# Patient Record
Sex: Male | Born: 2000 | Race: White | Hispanic: No | Marital: Married
Health system: Western US, Academic
[De-identification: ages and names within clinical notes are randomized; demographics above are authoritative.]

## PROBLEM LIST (undated history)

## (undated) DIAGNOSIS — F902 Attention-deficit hyperactivity disorder, combined type: Secondary | ICD-10-CM

## (undated) DIAGNOSIS — Z789 Other specified health status: Secondary | ICD-10-CM

## (undated) HISTORY — PX: ADENOIDECTOMY: SUR15

## (undated) HISTORY — PX: LYMPH NODE BIOPSY: SHX201

## (undated) HISTORY — PX: TONSILLECTOMY: SUR1361

---
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
## 2022-09-27 ENCOUNTER — Ambulatory Visit (HOSPITAL_BASED_OUTPATIENT_CLINIC_OR_DEPARTMENT_OTHER): Payer: TRICARE Prime—HMO

## 2022-09-27 ENCOUNTER — Inpatient Hospital Stay (HOSPITAL_BASED_OUTPATIENT_CLINIC_OR_DEPARTMENT_OTHER): Payer: TRICARE Prime—HMO

## 2022-09-27 ENCOUNTER — Inpatient Hospital Stay
Admission: AC | Admit: 2022-09-27 | Discharge: 2022-09-28 | DRG: 563 | Disposition: A | Discharge status: Home / Self Care | Payer: TRICARE Prime—HMO | Attending: Student in an Organized Health Care Education/Training Program | Admitting: Student in an Organized Health Care Education/Training Program

## 2022-09-27 DIAGNOSIS — T07XXXA Unspecified multiple injuries, initial encounter: Secondary | ICD-10-CM | POA: Diagnosis present

## 2022-09-27 DIAGNOSIS — S40211A Abrasion of right shoulder, initial encounter: Secondary | ICD-10-CM | POA: Diagnosis present

## 2022-09-27 DIAGNOSIS — S70312A Abrasion, left thigh, initial encounter: Secondary | ICD-10-CM | POA: Diagnosis present

## 2022-09-27 DIAGNOSIS — S42021A Displaced fracture of shaft of right clavicle, initial encounter for closed fracture: Principal | ICD-10-CM | POA: Diagnosis present

## 2022-09-27 DIAGNOSIS — Z7409 Other reduced mobility: Secondary | ICD-10-CM | POA: Insufficient documentation

## 2022-09-27 DIAGNOSIS — S80211A Abrasion, right knee, initial encounter: Secondary | ICD-10-CM | POA: Diagnosis present

## 2022-09-27 DIAGNOSIS — S71012A Laceration without foreign body, left hip, initial encounter: Secondary | ICD-10-CM | POA: Diagnosis present

## 2022-09-27 DIAGNOSIS — S50311A Abrasion of right elbow, initial encounter: Secondary | ICD-10-CM | POA: Diagnosis present

## 2022-09-27 DIAGNOSIS — T1490XA Injury, unspecified, initial encounter: Secondary | ICD-10-CM | POA: Diagnosis present

## 2022-09-27 DIAGNOSIS — S70311A Abrasion, right thigh, initial encounter: Secondary | ICD-10-CM | POA: Diagnosis present

## 2022-09-27 DIAGNOSIS — F17290 Nicotine dependence, other tobacco product, uncomplicated: Secondary | ICD-10-CM | POA: Diagnosis present

## 2022-09-27 DIAGNOSIS — S42001A Fracture of unspecified part of right clavicle, initial encounter for closed fracture: Secondary | ICD-10-CM | POA: Diagnosis present

## 2022-09-27 DIAGNOSIS — S70212A Abrasion, left hip, initial encounter: Secondary | ICD-10-CM | POA: Diagnosis present

## 2022-09-27 DIAGNOSIS — Y92488 Other paved roadways as the place of occurrence of the external cause: Secondary | ICD-10-CM

## 2022-09-27 DIAGNOSIS — S80811A Abrasion, right lower leg, initial encounter: Secondary | ICD-10-CM | POA: Diagnosis present

## 2022-09-27 DIAGNOSIS — Z041 Encounter for examination and observation following transport accident: Secondary | ICD-10-CM

## 2022-09-27 DIAGNOSIS — Z789 Other specified health status: Secondary | ICD-10-CM | POA: Insufficient documentation

## 2022-09-27 DIAGNOSIS — G8911 Acute pain due to trauma: Secondary | ICD-10-CM | POA: Insufficient documentation

## 2022-09-27 DIAGNOSIS — Z289 Immunization not carried out for unspecified reason: Secondary | ICD-10-CM

## 2022-09-27 DIAGNOSIS — S30810A Abrasion of lower back and pelvis, initial encounter: Secondary | ICD-10-CM | POA: Diagnosis present

## 2022-09-27 DIAGNOSIS — S71002A Unspecified open wound, left hip, initial encounter: Secondary | ICD-10-CM

## 2022-09-27 DIAGNOSIS — F909 Attention-deficit hyperactivity disorder, unspecified type: Secondary | ICD-10-CM | POA: Diagnosis present

## 2022-09-27 LAB — VBG + CHEM PANEL + H&H RC
Base Excess, Ven (iSTAT): 4 mmol/L (ref <=3)
Bicarbonate, Ven (iSTAT): 29.3 mmol/L (ref 20.0–29.0)
Calcium, Ionized Ven (iSTAT): 1.22 mmol/L (ref 1.13–1.32)
FiO2, Ven (iSTAT): 21
Glucose, Ven (iSTAT): 99 mg/dL (ref 70–105)
Hct, Ven (iSTAT): 43 % (ref 38–51)
Hgb, Ven (Est.) (iSTAT): 14.6 g/dL (ref 12–17)
O2 SAT, Ven (iSTAT): 43 % — ABNORMAL LOW (ref 95–98)
Potassium, Ven (iSTAT): 3.2 mmol/L — ABNORMAL LOW (ref 3.5–4.9)
Sodium, Ven (iSTAT): 139 mmol/L (ref 138–146)
Temp, Ven (iSTAT): 97.4
pCO2, Ven (T) (iSTAT): 45 mmHg (ref 40–52)
pCO2, Ven (Uncorr) (iSTAT): 46.3 mmHg (ref 40–52)
pH, Ven (T) (iSTAT): 7.42 (ref 7.33–7.40)
pH, Ven (Uncorr) (iSTAT): 7.41 (ref 7.33–7.40)
pO2, Ven (T) (iSTAT): 23 mmHg — ABNORMAL LOW (ref 25–44)
pO2, Ven (Uncorr) (iSTAT): 24 mmHg — ABNORMAL LOW (ref 25–44)

## 2022-09-27 LAB — BASIC METABOLIC PANEL, BLOOD
Anion Gap: 13 mmol/L (ref 7–15)
BUN: 12 mg/dL (ref 8–23)
Bicarbonate: 24 mmol/L (ref 22–29)
Calcium: 9.3 mg/dL (ref 8.2–9.6)
Chloride: 104 mmol/L (ref 98–107)
Creatinine: 1.01 mg/dL (ref 0.67–1.17)
Glucose: 100 mg/dL — ABNORMAL HIGH (ref 70–99)
Potassium: 3.4 mmol/L — ABNORMAL LOW (ref 3.5–5.1)
Sodium: 141 mmol/L (ref 136–145)
eGFR Based on CKD-EPI 2021 Equation: 58 mL/min/{1.73_m2}

## 2022-09-27 LAB — HEMOGRAM, BLOOD
Hct: 41.7 %
Hgb: 15 gm/dL
MCH: 32 pg (ref 26.0–32.0)
MCHC: 36 g/dL (ref 32.0–36.0)
MCV: 88.9 um3 (ref 79.0–95.0)
MPV: 10.9 fL (ref 9.4–12.4)
Plt Count: 304 10*3/uL (ref 140–370)
RBC: 4.69 10*6/uL
RDW: 13.4 % (ref 12.0–14.0)
WBC: 7.4 10*3/uL (ref 4.0–10.0)

## 2022-09-27 LAB — PROTHROMBIN TIME, BLOOD
INR: 1
PT,Patient: 11.2 s (ref 9.7–12.5)

## 2022-09-27 LAB — PHOSPHORUS, BLOOD: Phosphorous: 2.7 mg/dL (ref 2.7–4.5)

## 2022-09-27 LAB — ALCOHOL, BLOOD: Alcohol: <11 mg/dL

## 2022-09-27 LAB — MAGNESIUM, BLOOD: Magnesium: 1.8 mg/dL (ref 1.7–2.3)

## 2022-09-27 LAB — APTT, BLOOD: PTT: 21 s — ABNORMAL LOW (ref 27–36)

## 2022-09-27 MED ORDER — OXYCODONE HCL 5 MG OR TABS
5.0000 mg | ORAL_TABLET | ORAL | Status: DC | PRN
Start: 2022-09-27 — End: 2022-09-28
  Administered 2022-09-27: 5 mg via ORAL
  Filled 2022-09-27: qty 1

## 2022-09-27 MED ORDER — SODIUM CHLORIDE 0.9 % IV SOLN
INTRAVENOUS | Status: DC
Start: 2022-09-27 — End: 2022-09-27

## 2022-09-27 MED ORDER — BISACODYL 10 MG RE SUPP
10.0000 mg | Freq: Every day | RECTAL | Status: DC | PRN
Start: 2022-09-27 — End: 2022-09-28

## 2022-09-27 MED ORDER — NALOXONE HCL 0.4 MG/ML IJ SOLN
0.1000 mg | INTRAMUSCULAR | Status: DC | PRN
Start: 2022-09-27 — End: 2022-09-28

## 2022-09-27 MED ORDER — HYDROMORPHONE HCL 1 MG/ML IJ SOLN
0.5000 mg | INTRAMUSCULAR | Status: DC | PRN
Start: 2022-09-27 — End: 2022-09-27

## 2022-09-27 MED ORDER — OXYCODONE HCL 5 MG OR TABS
5.0000 mg | ORAL_TABLET | ORAL | Status: DC | PRN
Start: 2022-09-27 — End: 2022-09-27

## 2022-09-27 MED ORDER — DEXTROSE-NACL 5-0.45 % IV SOLN (CUSTOM)
INTRAVENOUS | Status: DC
Start: 2022-09-28 — End: 2022-09-28

## 2022-09-27 MED ORDER — ALUM & MAG HYDROXIDE-SIMETH 200-200-20 MG/5ML OR SUSP
30.0000 mL | Freq: Four times a day (QID) | ORAL | Status: DC | PRN
Start: 2022-09-27 — End: 2022-09-28

## 2022-09-27 MED ORDER — OXYCODONE HCL 10 MG OR TABS
10.0000 mg | ORAL_TABLET | ORAL | Status: DC | PRN
Start: 2022-09-27 — End: 2022-09-28
  Administered 2022-09-28: 10 mg via ORAL
  Filled 2022-09-27: qty 1

## 2022-09-27 MED ORDER — SENNA 8.6 MG OR TABS
2.0000 | ORAL_TABLET | Freq: Every evening | ORAL | Status: DC | PRN
Start: 2022-09-27 — End: 2022-09-28

## 2022-09-27 MED ORDER — POLYETHYLENE GLYCOL 3350 OR PACK
17.0000 g | PACK | Freq: Every day | ORAL | Status: DC
Start: 2022-09-28 — End: 2022-09-28
  Administered 2022-09-28: 17 g via ORAL
  Filled 2022-09-27: qty 1

## 2022-09-27 MED ORDER — IBUPROFEN 600 MG OR TABS
600.0000 mg | ORAL_TABLET | Freq: Four times a day (QID) | ORAL | Status: DC | PRN
Start: 2022-09-27 — End: 2022-09-28

## 2022-09-27 MED ORDER — BACITRACIN-POLYMYXIN B 500-10000 UNIT/GM EX OINT
TOPICAL_OINTMENT | Freq: Two times a day (BID) | CUTANEOUS | Status: DC
Start: 2022-09-27 — End: 2022-09-28
  Filled 2022-09-27: qty 28.3

## 2022-09-27 MED ORDER — TETANUS-DIPHTH-ACELL PERTUSSIS 5-2.5-18.5 LF-MCG/0.5 IM WRAPPED RECORD
0.5000 mL | Freq: Once | INTRAMUSCULAR | Status: DC
Start: 2022-09-27 — End: 2022-09-28

## 2022-09-27 MED ORDER — FLEET ADULT ENEMA 7-19 GM/118ML RE ENEM
1.0000 | ENEMA | Freq: Every day | RECTAL | Status: DC | PRN
Start: 2022-09-27 — End: 2022-09-27

## 2022-09-27 MED ORDER — ACETAMINOPHEN 325 MG PO TABS
975.0000 mg | ORAL_TABLET | Freq: Three times a day (TID) | ORAL | Status: DC
Start: 2022-09-27 — End: 2022-09-28
  Administered 2022-09-27 – 2022-09-28 (×3): 975 mg via ORAL
  Filled 2022-09-27 (×3): qty 3

## 2022-09-27 MED ORDER — ONDANSETRON HCL 4 MG/2ML IV SOLN
4.0000 mg | Freq: Four times a day (QID) | INTRAMUSCULAR | Status: DC | PRN
Start: 2022-09-27 — End: 2022-09-28

## 2022-09-27 MED ORDER — KETOROLAC TROMETHAMINE 15 MG/ML IJ SOLN
30.0000 mg | Freq: Once | INTRAMUSCULAR | Status: AC
Start: 2022-09-27 — End: 2022-09-27
  Administered 2022-09-27: 30 mg via INTRAVENOUS
  Filled 2022-09-27: qty 2

## 2022-09-27 NOTE — Event / Update (Addendum)
C-spine clearance:   Patient is A&O, following commands.   The patient's cervical spine was negative for fractures on CT scan. The anterior portion of the collar was removed and the patient exhibited no tenderness to palpation over the cervical spine. The patient had full ROM on flexion, extension and rotation of the neck without pain. The posterior portion of the collar was removed and c-spine precautions were discontinued.     Savio Albrecht, MD-PhD  General Surgery, PGY3
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
## 2022-09-27 NOTE — ED Notes (Signed)
Imagining plan discussed with Dr. Kathrin Greathouse, Dr. Emilio Aspen.  Pt. Remains GCS:15

## 2022-09-28 DIAGNOSIS — Z789 Other specified health status: Secondary | ICD-10-CM

## 2022-09-28 DIAGNOSIS — G8911 Acute pain due to trauma: Secondary | ICD-10-CM

## 2022-09-28 DIAGNOSIS — Z7409 Other reduced mobility: Secondary | ICD-10-CM

## 2022-09-28 LAB — BASIC METABOLIC PANEL, BLOOD
Anion Gap: 11 mmol/L (ref 7–15)
BUN: 12 mg/dL (ref 8–23)
Bicarbonate: 23 mmol/L (ref 22–29)
Calcium: 8.7 mg/dL (ref 8.2–9.6)
Chloride: 104 mmol/L (ref 98–107)
Creatinine: 0.95 mg/dL (ref 0.67–1.17)
Glucose: 95 mg/dL (ref 70–99)
Potassium: 3.7 mmol/L (ref 3.5–5.1)
Sodium: 138 mmol/L (ref 136–145)
eGFR Based on CKD-EPI 2021 Equation: >60 mL/min/{1.73_m2}

## 2022-09-28 LAB — CBC WITH DIFF, BLOOD
ANC-Automated: 7.5 10*3/uL — ABNORMAL HIGH (ref 1.6–7.0)
Abs Basophils: 0 10*3/uL (ref <0.2)
Abs Eosinophils: 0 10*3/uL (ref 0.0–0.5)
Abs Lymphs: 1.5 10*3/uL (ref 0.8–3.1)
Abs Monos: 1.1 10*3/uL — ABNORMAL HIGH (ref 0.2–0.8)
Basophils: 0 %
Eosinophils: 0 %
Hct: 42.2 %
Hgb: 14.2 gm/dL
Lymphocytes: 15 %
MCH: 30.5 pg (ref 26.0–32.0)
MCHC: 33.6 g/dL (ref 32.0–36.0)
MCV: 90.6 um3 (ref 79.0–95.0)
MPV: 10.1 fL (ref 9.4–12.4)
Monocytes: 11 %
Plt Count: 177 10*3/uL (ref 140–370)
RBC: 4.66 10*6/uL
RDW: 13.6 % (ref 12.0–14.0)
Segs: 74 %
WBC: 10.1 10*3/uL — ABNORMAL HIGH (ref 4.0–10.0)

## 2022-09-28 LAB — RBC MORPHOLOGY
Plt Est: ADEQUATE
RBC Comment: NORMAL

## 2022-09-28 LAB — GLUCOSE (POCT): Glucose (POCT): 103 mg/dL — ABNORMAL HIGH (ref 70–99)

## 2022-09-28 MED ORDER — IBUPROFEN 600 MG OR TABS
600.0000 mg | ORAL_TABLET | Freq: Four times a day (QID) | ORAL | 0 refills | Status: AC | PRN
Start: 2022-09-28 — End: ?

## 2022-09-28 MED ORDER — SENNA 8.6 MG OR TABS
17.2000 mg | ORAL_TABLET | Freq: Every evening | ORAL | 0 refills | Status: AC | PRN
Start: 2022-09-28 — End: ?

## 2022-09-28 MED ORDER — HYDROCODONE-ACETAMINOPHEN 10-325 MG OR TABS
1.0000 | ORAL_TABLET | Freq: Four times a day (QID) | ORAL | 0 refills | Status: AC | PRN
Start: 2022-09-28 — End: 2022-12-27

## 2022-09-28 MED ORDER — ENOXAPARIN SODIUM 40 MG/0.4ML IJ SOSY
40.0000 mg | PREFILLED_SYRINGE | Freq: Two times a day (BID) | INTRAMUSCULAR | Status: DC
Start: 2022-09-28 — End: 2022-09-28
  Administered 2022-09-28: 40 mg via SUBCUTANEOUS
  Filled 2022-09-28: qty 1

## 2022-09-28 MED ORDER — BACITRACIN-POLYMYXIN B 500-10000 UNIT/GM EX OINT
1.0000 | TOPICAL_OINTMENT | Freq: Two times a day (BID) | CUTANEOUS | 0 refills | Status: AC
Start: 2022-09-28 — End: ?

## 2022-09-28 MED ORDER — LIDOCAINE 4 % EX PTCH
1.0000 | MEDICATED_PATCH | CUTANEOUS | Status: DC
Start: 2022-09-28 — End: 2022-09-28
  Administered 2022-09-28: 1 via TRANSDERMAL
  Filled 2022-09-28: qty 1

## 2022-09-28 MED ORDER — GABAPENTIN 100 MG OR CAPS
100.0000 mg | ORAL_CAPSULE | Freq: Three times a day (TID) | ORAL | 0 refills | Status: AC
Start: 2022-09-28 — End: ?

## 2022-09-28 MED ORDER — GABAPENTIN 100 MG OR CAPS
100.0000 mg | ORAL_CAPSULE | Freq: Three times a day (TID) | ORAL | Status: DC
Start: 2022-09-28 — End: 2022-09-28
  Filled 2022-09-28: qty 1

## 2022-09-28 NOTE — Discharge Instructions (Signed)
Diagnosis and Reason for Admission    You were admitted to the hospital for the following reason(s):  Closed displaced fracture of right clavicle, initial encounter    Your full diagnosis list is located on this After Visit Summary in the Hospital Problems section.    What Happened During Your Hospital Stay  The main tests and treatments done for you during this hospitalization:    - ATLS  (Advanced Trauma Life Support) survey/ protocol  - Imaging:   Orders Placed This Encounter   Procedures    X-Ray Chest Single View    X-Ray Pelvis 1 Or 2 Views    US Abdomen Limited    X-Ray Shoulder Complete Min 2 Views - Right    X-Ray Clavicle Complete - Right    X-Ray Cervical Spine 2 Or 3 Views       Procedures/Surgeries completed during this hospitalization:  Imaging as above    Instructions for After Discharge  Your diet at home should be:  regular diet.    Your activity level at home should be:  Stay active, don't over do it. Make good choices avoiding alcohol or other substances that may cloud your judgement.    Specific activity restrictions:    - Do not drive while taking narcotic pain medications.  - Do not work with heavy or complex machinery while taking narcotic pain medications.  - Use assistive devices for safe mobilization if directed   - Ask for assistance when needed   - Weight bearing status: Non weight bearing right arm, wear sling   - Further instructions will be provided at follow up appointment       Wound or tube care instructions:  -Cleanse wounds 1-2x daily. Pat dry. Apply polysporin/bacitracin then xeroform yellow gauze. Wrap with gauze  - Keep any open wound area clean and dry.   - Do not submerge area under water for 21 days or until new skin has formed   - OK to shower, do not scrub wounds,  cover any splints or bandages with plastic bag & tape. Keep the splint/cast clean and dry. In the event that your cast becomes wet or overly soiled, it is important to contact the surgical team in order to  replace.     Your medication list is located on this After Visit Summary in the Current Discharge Medication List section.  Your nurse will review this information with you before you leave the hospital.    Any Immunizations updated are listed below:   There is no immunization history for the selected administration types on file for this patient.     Key changes to your medication regimen include (if applicable):   - Continue taking your current home medications as prescribed by your primary care physician.    It is very important for you to keep a current medication list with you in order to assist your doctors with your medical care.  Bring this After Visit Summary with you to your follow up appointments.    Reasons to Contact a Doctor Urgently  Contact your primary care physician or clinic, or return to the nearest Emergency Department if:  Increased or uncontrolled pain.  Severe abdominal pain.  Nausea and vomiting.  Severe abdominal bloating.  Jaundice (yellowing of the skin and eyes).  Redness or swelling at the surgical (or wound) site.  Discharge or leakage from the surgical (or wound) site.  Fevers or chills.  Bleeding from surgical (or wound) site.  Shortness of  breath or difficulty breathing.  Chest pains or palpitations.    If you have any questions about your hospital care, your medications, or if you have new or concerning symptoms soon after going home from the hospital, and you need to contact your hospital physician, contact the [facility] operator at [phone].    Once you are able to see your primary care physician (PCP) or primary clinic, they will then be responsible for further medication refills, or appointment referrals.    What Needs to Happen Next After Discharge -- Appointments and Follow Up    Any appointments already scheduled at [facility] will be listed in the Future Appointments section at the top of this After Visit Summary.  Any appointments that have been  requested, but have not yet been scheduled, will be listed below that under Post Discharge Referrals.    Follow up appointments scheduled for:  No future appointments.    Further instructions will be provided at follow up appointments     If not listed above, referrals placed for the follow clinics as well, you will need to call the schedule an appointment with the following:   - Follow up at [facility] up with your primary care provider within 7 days of discharge for check in and continued monitoring       **MAKE SURE TO TAKE PAIN MEDICATIONS IF [facility]. THERE WILL BE NO PAIN MEDICATIONS AVAILABLE IN THE CLINIC**    Medical Home Information    Your primary care provider or clinic currently on file at [facility] is: No primary care provider on file.    Handouts Given to You (if applicable)

## 2022-09-28 NOTE — Interdisciplinary (Signed)
Occupational Therapy Evaluation and Discharge    Admitting Physician:  Bing Ree, MD  Admission Date 09/27/2022    Inpatient Diagnosis:     IP Start of Service  Start of Care: 09/28/22  Onset Date: 09/27/2022  Reason for referral: Range of motion/strength limitations;Activity tolerance limitation    Preferred Language:English         No past medical history on file.   No past surgical history on file.     OT Acute       Row Name 09/28/22 1200          Type of Visit    Type of Occupational Therapy note Occupational Therapy Evaluation and Discharge       [place] Name 09/28/22 1200          Treatment Time    Treatment Start Time 1100     Total TIMED Treatment (min) 15     Total Treatment Time (min) 25       Row Name 09/28/22 1200          Treatment Precautions/Restrictions    Precautions/Restrictions Weight bearing restrictions     Right Upper Extremity Non-weight bearing       Row Name 09/28/22 1200          Medical History    History of presenting condition Closed displaced fracture of right clavicle      Fall history No falls reported in the last 6 months     Dominant Side Right       Row Name 09/28/22 1200          Functional History    Prior Level of Function No deficits     General ADL/Self-Care Assistance Needs None- Independent with ADLs and self care     Equipment required for mobility in the home None     Other Functional History Information [employer], may get outpatient surgery at [facility] over the next few days.       [place] Name 09/28/22 1200          Social History    Living Situation Lives with spouse/partner     [place] accessibility Performs activities of daily living (ADL's) on one level       [place] Name 09/28/22 1200          Subjective    Subjective information "It's very sore when I move (R shoulder)."     Patient status Patient agreeable to treatment       [place] Name 09/28/22 1200          Pain Assessment    Pain Asssessment Tool Numeric Pain Rating Scale       Row Name 09/28/22  1200          Numeric Pain Rating Scale    Pain Intensity - rating at present 6     Pain Intensity- rating after treatment 6     Location R shoulder/clavicle with movement       Row Name 09/28/22 1200          Objective    Other  Cognitive Status Information Intact     Coordination/Motor Control  Information Intact other than R shoulder limitations.     Other Balance Information No impairments     Other  Extremity Assessment  Information R elbow, hand and wrist AROM WFL, limited shoulder flexion or abduction due to pain with any active movement.     Functional  Mobility No limitations or impairments in functional mobility noted. Patient independent in mobility activities of daily living     Other Objective Findings Educated pt on pendulum motions for L shoulder, methods to manage ADLs in the short term prior to expected surgery.                          Eval cont.       Row Name 09/28/22 1200          Patient/Family Education    Learner(s) Patient     Learner response to rehab patient education interventions Verbalizes understanding;Able to return demonstrate teaching       Row Name 09/28/22 1200          Assessment    Assessment Pt presents with pain and decreased ROM to R shoulder s/p clavicle fx now NWB with sling and awaiting surgery. Pt demonstrates understanding of basic exercises and activity modifications to manage RUE impairments until further directed by his ortho team at [facility]. No further equipment or OT recommendations at this time. He will certainly benefit from outpatient OT or PT after surgery plans clarified for continued RUE rehab.     Rehab Potential Excellent       Row Name 09/28/22 1200          Patient stated Goal    Patient stated goal --       Row Name 09/28/22 1200          Treatment Plan    Frequency of treatment Patient appropriate for discharge from therapy     Duration of treatment (number of visits) One time only, further treatment not indicated     Status of treatment One time  only treatment, further skilled therapy not indicated       Row Name 09/28/22 1200          Therapy Plan Communication    Therapy Plan Communication Discussed therapy plan with Nursing and/or Physician       Row Name 09/28/22 1200          Occupational Therapy Patient Discharge Instructions    Your Occupational Therapist suggests the following Continue to complete your self care Activities of Daily Living as frequently as possible       Row Name 09/28/22 1200          Type of Eval    Low Complexity [phone]) Completed       Row Name 09/28/22 1200          Therapeutic Procedures    Self-Care/ADL Training (60454) Patient education         Total TIMED Treatment (min) 15                     The occupational therapist of record is endorsed by evaluating occupational therapist.
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
## 2022-10-05 ENCOUNTER — Other Ambulatory Visit: Payer: Self-pay

## 2022-10-05 NOTE — Telephone Encounter (Signed)
Automated Call     An automated call outreach was sent to patient for a post discharge needs assessment. The patient was unreached after 2 automated attempts.       [facility], [facility]   [phone], opt 2
